# Patient Record
Sex: Male | Born: 1970 | Race: White | Hispanic: No | Marital: Single | Smoking: Never smoker
Health system: Southern US, Community
[De-identification: ages and names within clinical notes are randomized; demographics above are authoritative.]

## PROBLEM LIST (undated history)

## (undated) DIAGNOSIS — J45909 Unspecified asthma, uncomplicated: Secondary | ICD-10-CM

## (undated) DIAGNOSIS — F32A Depression, unspecified: Secondary | ICD-10-CM

## (undated) DIAGNOSIS — F329 Major depressive disorder, single episode, unspecified: Secondary | ICD-10-CM

## (undated) DIAGNOSIS — B019 Varicella without complication: Secondary | ICD-10-CM

## (undated) DIAGNOSIS — K219 Gastro-esophageal reflux disease without esophagitis: Secondary | ICD-10-CM

## (undated) HISTORY — DX: Varicella without complication: B01.9

## (undated) HISTORY — DX: Gastro-esophageal reflux disease without esophagitis: K21.9

## (undated) HISTORY — DX: Major depressive disorder, single episode, unspecified: F32.9

## (undated) HISTORY — PX: BACK SURGERY: SHX140

## (undated) HISTORY — DX: Depression, unspecified: F32.A

## (undated) HISTORY — PX: TONSILLECTOMY: SUR1361

---

## 2015-01-06 ENCOUNTER — Ambulatory Visit (HOSPITAL_COMMUNITY): Payer: Self-pay | Admitting: Certified Registered Nurse Anesthetist

## 2015-01-06 ENCOUNTER — Ambulatory Visit (HOSPITAL_COMMUNITY): Payer: Self-pay

## 2015-01-06 ENCOUNTER — Encounter (HOSPITAL_COMMUNITY)
Admission: RE | Disposition: A | Discharge status: Home / Self Care | Payer: Self-pay | Source: Ambulatory Visit | Attending: Neurosurgery

## 2015-01-06 ENCOUNTER — Encounter (HOSPITAL_COMMUNITY): Payer: Self-pay | Admitting: *Deleted

## 2015-01-06 ENCOUNTER — Other Ambulatory Visit (HOSPITAL_COMMUNITY): Payer: Self-pay | Admitting: Neurosurgery

## 2015-01-06 ENCOUNTER — Ambulatory Visit (HOSPITAL_COMMUNITY)
Admission: RE | Admit: 2015-01-06 | Discharge: 2015-01-07 | Disposition: A | Discharge status: Home / Self Care | Payer: Self-pay | Source: Ambulatory Visit | Attending: Neurosurgery | Admitting: Neurosurgery

## 2015-01-06 DIAGNOSIS — M5126 Other intervertebral disc displacement, lumbar region: Secondary | ICD-10-CM | POA: Diagnosis present

## 2015-01-06 DIAGNOSIS — Z419 Encounter for procedure for purposes other than remedying health state, unspecified: Secondary | ICD-10-CM

## 2015-01-06 DIAGNOSIS — J45909 Unspecified asthma, uncomplicated: Secondary | ICD-10-CM | POA: Insufficient documentation

## 2015-01-06 HISTORY — DX: Unspecified asthma, uncomplicated: J45.909

## 2015-01-06 HISTORY — PX: LUMBAR LAMINECTOMY/DECOMPRESSION MICRODISCECTOMY: SHX5026

## 2015-01-06 LAB — CBC
HEMATOCRIT: 48.2 % (ref 39.0–52.0)
Hemoglobin: 16.3 g/dL (ref 13.0–17.0)
MCH: 30.6 pg (ref 26.0–34.0)
MCHC: 33.8 g/dL (ref 30.0–36.0)
MCV: 90.4 fL (ref 78.0–100.0)
PLATELETS: 204 10*3/uL (ref 150–400)
RBC: 5.33 MIL/uL (ref 4.22–5.81)
RDW: 12.7 % (ref 11.5–15.5)
WBC: 9.9 10*3/uL (ref 4.0–10.5)

## 2015-01-06 SURGERY — LUMBAR LAMINECTOMY/DECOMPRESSION MICRODISCECTOMY 1 LEVEL
Anesthesia: General | Site: Back | Laterality: Left

## 2015-01-06 MED ORDER — FENTANYL CITRATE 0.05 MG/ML IJ SOLN
INTRAMUSCULAR | Status: AC
  Filled 2015-01-06: qty 5

## 2015-01-06 MED ORDER — SODIUM CHLORIDE 0.9 % IJ SOLN: 3.0000 mL | INTRAMUSCULAR | Status: DC | PRN

## 2015-01-06 MED ORDER — MENTHOL 3 MG MT LOZG: 1.0000 | LOZENGE | OROMUCOSAL | Status: DC | PRN

## 2015-01-06 MED ORDER — ARTIFICIAL TEARS OP OINT
TOPICAL_OINTMENT | OPHTHALMIC | Status: DC | PRN
  Administered 2015-01-06: 1 via OPHTHALMIC

## 2015-01-06 MED ORDER — PHENOL 1.4 % MT LIQD: 1.0000 | OROMUCOSAL | Status: DC | PRN

## 2015-01-06 MED ORDER — LIDOCAINE-EPINEPHRINE 0.5 %-1:200000 IJ SOLN
INTRAMUSCULAR | Status: DC | PRN
  Administered 2015-01-06: 20 mL via INTRADERMAL
  Administered 2015-01-06: 10 mL via INTRADERMAL

## 2015-01-06 MED ORDER — THROMBIN 5000 UNITS EX SOLR
CUTANEOUS | Status: DC | PRN
  Administered 2015-01-06 (×2): 5000 [IU] via TOPICAL

## 2015-01-06 MED ORDER — FAMOTIDINE 20 MG PO TABS
20.0000 mg | ORAL_TABLET | Freq: Every day | ORAL | Status: DC
  Administered 2015-01-06: 20 mg via ORAL
  Filled 2015-01-06 (×2): qty 1

## 2015-01-06 MED ORDER — ONDANSETRON HCL 4 MG/2ML IJ SOLN
INTRAMUSCULAR | Status: AC
  Filled 2015-01-06: qty 2

## 2015-01-06 MED ORDER — GLYCOPYRROLATE 0.2 MG/ML IJ SOLN
INTRAMUSCULAR | Status: DC | PRN
  Administered 2015-01-06: 0.4 mg via INTRAVENOUS

## 2015-01-06 MED ORDER — GLYCOPYRROLATE 0.2 MG/ML IJ SOLN
INTRAMUSCULAR | Status: AC
  Filled 2015-01-06: qty 2

## 2015-01-06 MED ORDER — ACETAMINOPHEN 650 MG RE SUPP
650.0000 mg | RECTAL | Status: DC | PRN
Start: 2015-01-06 — End: 2015-01-07

## 2015-01-06 MED ORDER — MIDAZOLAM HCL 2 MG/2ML IJ SOLN
INTRAMUSCULAR | Status: AC
  Filled 2015-01-06: qty 2

## 2015-01-06 MED ORDER — MORPHINE SULFATE 2 MG/ML IJ SOLN: 1.0000 mg | INTRAMUSCULAR | Status: DC | PRN

## 2015-01-06 MED ORDER — POTASSIUM CHLORIDE IN NACL 20-0.9 MEQ/L-% IV SOLN
INTRAVENOUS | Status: DC
  Filled 2015-01-06 (×3): qty 1000

## 2015-01-06 MED ORDER — ROCURONIUM BROMIDE 100 MG/10ML IV SOLN
INTRAVENOUS | Status: DC | PRN
  Administered 2015-01-06: 50 mg via INTRAVENOUS

## 2015-01-06 MED ORDER — NEOSTIGMINE METHYLSULFATE 10 MG/10ML IV SOLN
INTRAVENOUS | Status: DC | PRN
  Administered 2015-01-06: 3 mg via INTRAVENOUS

## 2015-01-06 MED ORDER — NEOSTIGMINE METHYLSULFATE 10 MG/10ML IV SOLN
INTRAVENOUS | Status: AC
  Filled 2015-01-06: qty 1

## 2015-01-06 MED ORDER — CYCLOBENZAPRINE HCL 10 MG PO TABS
ORAL_TABLET | ORAL | Status: AC
  Administered 2015-01-06: 10 mg via ORAL
  Filled 2015-01-06: qty 1

## 2015-01-06 MED ORDER — LACTATED RINGERS IV SOLN
INTRAVENOUS | Status: DC | PRN
  Administered 2015-01-06 (×2): via INTRAVENOUS

## 2015-01-06 MED ORDER — CEFAZOLIN SODIUM-DEXTROSE 2-3 GM-% IV SOLR
INTRAVENOUS | Status: AC
  Filled 2015-01-06: qty 50

## 2015-01-06 MED ORDER — KETOROLAC TROMETHAMINE 30 MG/ML IJ SOLN
INTRAMUSCULAR | Status: AC
  Filled 2015-01-06: qty 1

## 2015-01-06 MED ORDER — PNEUMOCOCCAL VAC POLYVALENT 25 MCG/0.5ML IJ INJ
0.5000 mL | INJECTION | INTRAMUSCULAR | Status: DC
  Filled 2015-01-06: qty 0.5

## 2015-01-06 MED ORDER — EPHEDRINE SULFATE 50 MG/ML IJ SOLN
INTRAMUSCULAR | Status: DC | PRN
  Administered 2015-01-06: 5 mg via INTRAVENOUS

## 2015-01-06 MED ORDER — ONDANSETRON HCL 4 MG/2ML IJ SOLN: 4.0000 mg | INTRAMUSCULAR | Status: DC | PRN

## 2015-01-06 MED ORDER — ALBUTEROL (5 MG/ML) CONTINUOUS INHALATION SOLN
3.0000 mL | INHALATION_SOLUTION | Freq: Four times a day (QID) | RESPIRATORY_TRACT | Status: DC | PRN
  Filled 2015-01-06: qty 20

## 2015-01-06 MED ORDER — FENTANYL CITRATE 0.05 MG/ML IJ SOLN
INTRAMUSCULAR | Status: DC | PRN
  Administered 2015-01-06: 100 ug via INTRAVENOUS
  Administered 2015-01-06: 150 ug via INTRAVENOUS
  Administered 2015-01-06: 50 ug via INTRAVENOUS

## 2015-01-06 MED ORDER — OXYCODONE-ACETAMINOPHEN 5-325 MG PO TABS: 1.0000 | ORAL_TABLET | ORAL | Status: DC | PRN

## 2015-01-06 MED ORDER — SODIUM CHLORIDE 0.9 % IJ SOLN
3.0000 mL | Freq: Two times a day (BID) | INTRAMUSCULAR | Status: DC
  Administered 2015-01-06: 3 mL via INTRAVENOUS

## 2015-01-06 MED ORDER — LIDOCAINE HCL (CARDIAC) 20 MG/ML IV SOLN
INTRAVENOUS | Status: DC | PRN
  Administered 2015-01-06: 80 mg via INTRAVENOUS

## 2015-01-06 MED ORDER — LACTATED RINGERS IV SOLN
INTRAVENOUS | Status: DC
  Administered 2015-01-06: 15:00:00 via INTRAVENOUS

## 2015-01-06 MED ORDER — 0.9 % SODIUM CHLORIDE (POUR BTL) OPTIME
TOPICAL | Status: DC | PRN
  Administered 2015-01-06: 1000 mL

## 2015-01-06 MED ORDER — SODIUM CHLORIDE 0.9 % IV SOLN: 250.0000 mL | INTRAVENOUS | Status: DC

## 2015-01-06 MED ORDER — ACETAMINOPHEN 325 MG PO TABS: 650.0000 mg | ORAL_TABLET | ORAL | Status: DC | PRN

## 2015-01-06 MED ORDER — ALBUTEROL SULFATE HFA 108 (90 BASE) MCG/ACT IN AERS
INHALATION_SPRAY | RESPIRATORY_TRACT | Status: DC | PRN
  Administered 2015-01-06 (×2): 4 via RESPIRATORY_TRACT

## 2015-01-06 MED ORDER — KETOROLAC TROMETHAMINE 30 MG/ML IJ SOLN
30.0000 mg | Freq: Four times a day (QID) | INTRAMUSCULAR | Status: DC
  Administered 2015-01-06 – 2015-01-07 (×3): 30 mg via INTRAVENOUS
  Filled 2015-01-06 (×6): qty 1

## 2015-01-06 MED ORDER — SENNA 8.6 MG PO TABS
1.0000 | ORAL_TABLET | Freq: Two times a day (BID) | ORAL | Status: DC
  Administered 2015-01-06 – 2015-01-07 (×2): 8.6 mg via ORAL
  Filled 2015-01-06 (×3): qty 1

## 2015-01-06 MED ORDER — PROPOFOL 10 MG/ML IV BOLUS
INTRAVENOUS | Status: DC | PRN
  Administered 2015-01-06: 200 mg via INTRAVENOUS

## 2015-01-06 MED ORDER — CYCLOBENZAPRINE HCL 10 MG PO TABS
10.0000 mg | ORAL_TABLET | Freq: Three times a day (TID) | ORAL | Status: DC | PRN
  Administered 2015-01-06 – 2015-01-07 (×2): 10 mg via ORAL
  Filled 2015-01-06 (×3): qty 1

## 2015-01-06 MED ORDER — INFLUENZA VAC SPLIT QUAD 0.5 ML IM SUSY
0.5000 mL | PREFILLED_SYRINGE | INTRAMUSCULAR | Status: AC
  Administered 2015-01-07: 0.5 mL via INTRAMUSCULAR
  Filled 2015-01-06: qty 0.5

## 2015-01-06 MED ORDER — ONDANSETRON HCL 4 MG/2ML IJ SOLN
INTRAMUSCULAR | Status: DC | PRN
  Administered 2015-01-06: 4 mg via INTRAVENOUS

## 2015-01-06 MED ORDER — HEMOSTATIC AGENTS (NO CHARGE) OPTIME
TOPICAL | Status: DC | PRN
  Administered 2015-01-06: 1 via TOPICAL

## 2015-01-06 MED ORDER — HYDROCODONE-ACETAMINOPHEN 5-325 MG PO TABS
1.0000 | ORAL_TABLET | ORAL | Status: DC | PRN
  Administered 2015-01-06: 2 via ORAL
  Filled 2015-01-06: qty 2

## 2015-01-06 MED ORDER — MAGNESIUM CITRATE PO SOLN
1.0000 | Freq: Once | ORAL | Status: AC | PRN
  Filled 2015-01-06: qty 296

## 2015-01-06 SURGICAL SUPPLY — 52 items
BAG DECANTER FOR FLEXI CONT (MISCELLANEOUS) ×3 IMPLANT
BENZOIN TINCTURE PRP APPL 2/3 (GAUZE/BANDAGES/DRESSINGS) IMPLANT
BLADE CLIPPER SURG (BLADE) IMPLANT
BUR MATCHSTICK NEURO 3.0 LAGG (BURR) ×3 IMPLANT
CANISTER SUCT 3000ML PPV (MISCELLANEOUS) ×3 IMPLANT
CLOSURE WOUND 1/2 X4 (GAUZE/BANDAGES/DRESSINGS)
CONT SPEC 4OZ CLIKSEAL STRL BL (MISCELLANEOUS) ×3 IMPLANT
DECANTER SPIKE VIAL GLASS SM (MISCELLANEOUS) ×3 IMPLANT
DRAPE LAPAROTOMY 100X72X124 (DRAPES) ×3 IMPLANT
DRAPE MICROSCOPE LEICA (MISCELLANEOUS) ×3 IMPLANT
DRAPE POUCH INSTRU U-SHP 10X18 (DRAPES) ×3 IMPLANT
DRAPE SURG 17X23 STRL (DRAPES) ×3 IMPLANT
DURAPREP 26ML APPLICATOR (WOUND CARE) ×3 IMPLANT
ELECT REM PT RETURN 9FT ADLT (ELECTROSURGICAL) ×3
ELECTRODE REM PT RTRN 9FT ADLT (ELECTROSURGICAL) ×1 IMPLANT
GAUZE SPONGE 4X4 12PLY STRL (GAUZE/BANDAGES/DRESSINGS) IMPLANT
GAUZE SPONGE 4X4 16PLY XRAY LF (GAUZE/BANDAGES/DRESSINGS) IMPLANT
GLOVE BIO SURGEON STRL SZ 6.5 (GLOVE) ×6 IMPLANT
GLOVE BIO SURGEON STRL SZ8 (GLOVE) ×3 IMPLANT
GLOVE BIO SURGEONS STRL SZ 6.5 (GLOVE) ×3
GLOVE ECLIPSE 6.5 STRL STRAW (GLOVE) ×3 IMPLANT
GLOVE EXAM NITRILE LRG STRL (GLOVE) IMPLANT
GLOVE EXAM NITRILE MD LF STRL (GLOVE) IMPLANT
GLOVE EXAM NITRILE XL STR (GLOVE) IMPLANT
GLOVE EXAM NITRILE XS STR PU (GLOVE) IMPLANT
GLOVE INDICATOR 6.5 STRL GRN (GLOVE) ×3 IMPLANT
GLOVE INDICATOR 8.5 STRL (GLOVE) ×3 IMPLANT
GOWN STRL REUS W/ TWL LRG LVL3 (GOWN DISPOSABLE) ×3 IMPLANT
GOWN STRL REUS W/ TWL XL LVL3 (GOWN DISPOSABLE) IMPLANT
GOWN STRL REUS W/TWL 2XL LVL3 (GOWN DISPOSABLE) IMPLANT
GOWN STRL REUS W/TWL LRG LVL3 (GOWN DISPOSABLE) ×6
GOWN STRL REUS W/TWL XL LVL3 (GOWN DISPOSABLE)
KIT BASIN OR (CUSTOM PROCEDURE TRAY) ×3 IMPLANT
KIT ROOM TURNOVER OR (KITS) ×3 IMPLANT
LIQUID BAND (GAUZE/BANDAGES/DRESSINGS) ×3 IMPLANT
NEEDLE HYPO 25X1 1.5 SAFETY (NEEDLE) ×3 IMPLANT
NEEDLE SPNL 18GX3.5 QUINCKE PK (NEEDLE) IMPLANT
NS IRRIG 1000ML POUR BTL (IV SOLUTION) ×3 IMPLANT
PACK LAMINECTOMY NEURO (CUSTOM PROCEDURE TRAY) ×3 IMPLANT
PAD ARMBOARD 7.5X6 YLW CONV (MISCELLANEOUS) ×9 IMPLANT
RUBBERBAND STERILE (MISCELLANEOUS) ×6 IMPLANT
SPONGE LAP 4X18 X RAY DECT (DISPOSABLE) IMPLANT
SPONGE SURGIFOAM ABS GEL SZ50 (HEMOSTASIS) ×3 IMPLANT
STRIP CLOSURE SKIN 1/2X4 (GAUZE/BANDAGES/DRESSINGS) IMPLANT
SUT VIC AB 0 CT1 18XCR BRD8 (SUTURE) ×1 IMPLANT
SUT VIC AB 0 CT1 8-18 (SUTURE) ×2
SUT VIC AB 2-0 CT1 18 (SUTURE) ×3 IMPLANT
SUT VIC AB 3-0 SH 8-18 (SUTURE) ×6 IMPLANT
SYR 20ML ECCENTRIC (SYRINGE) ×3 IMPLANT
TOWEL OR 17X24 6PK STRL BLUE (TOWEL DISPOSABLE) ×3 IMPLANT
TOWEL OR 17X26 10 PK STRL BLUE (TOWEL DISPOSABLE) ×3 IMPLANT
WATER STERILE IRR 1000ML POUR (IV SOLUTION) ×3 IMPLANT

## 2015-01-06 NOTE — Op Note (Signed)
01/06/2015  8:02 PM  PATIENT:  Joel Cline  44 y.o. male presents with severe left lower extremity pain, and a large free fragment on the left at L4/5. He has agreed to operative decompression.   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 [name] was taken to the operating room, intubated and placed under a general anesthetic without difficulty. He was positioned prone on a Wilson frame with all pressure points padded. His back was prepped and draped in a sterile manner. I opened the skin with a 10 blade and carried the dissection down to the thoracolumbar fascia. I used both sharp dissection and the monopolar cautery to expose the lamina of 3,4, and 5. I confirmed my location with an intraoperative xray.  I used the drill, Kerrison punches, and curettes to perform a semihemilaminectomy of L4. I used the punches to remove the ligamentum flavum to expose the thecal sac. I brought the microscope into the operative field and with Dr.Stern's assistance we started our decompression of the spinal canal, thecal sac and L5 root(s). I cauterized epidural veins overlying the disc space then divided them sharply. I opened the disc space with a 15 blade and proceeded with the discectomy. I used pituitary rongeurs, curettes, and other instruments to remove disc material. After the discectomy was completed we inspected the L5 nerve root and felt it was well decompressed. I explored rostrally, laterally, medially, and caudally and was satisfied with the decompression. I irrigated the  wound, then closed in layers. I approximated the thoracolumbar fascia, subcutaneous, and subcuticular planes with vicryl sutures. I used dermabond for a sterile dressing.   PLAN OF CARE: Admit for overnight observation  PATIENT DISPOSITION:  PACU - hemodynamically stable.   Delay start of Pharmacological VTE agent (>24hrs) due to surgical blood loss or risk of bleeding:  yes

## 2015-01-06 NOTE — Anesthesia Postprocedure Evaluation (Signed)
  Anesthesia Post-op Note  Patient: Joel Cline  Procedure(s) Performed: Procedure(s) with comments: Left Lumbar 4-5 diskectomy (Left) - Left L4-5 diskectomy  Patient Location: PACU  Anesthesia Type:General  Level of Consciousness: awake, alert , oriented and patient cooperative  Airway and Oxygen Therapy: Patient Spontanous Breathing  Post-op Pain: mild  Post-op Assessment: Post-op Vital signs reviewed, Patient's Cardiovascular Status Stable, Respiratory Function Stable, Patent Airway, No signs of Nausea or vomiting and Pain level controlled  Post-op Vital Signs: stable  Last Vitals:  Filed Vitals:   01/06/15 2055  BP: 128/81  Pulse: 73  Temp: 36.7 C  Resp: 12    Complications: No apparent anesthesia complications

## 2015-01-06 NOTE — Transfer of Care (Signed)
Immediate Anesthesia Transfer of Care Note  Patient: Joel Cline  Procedure(s) Performed: Procedure(s) with comments: Left Lumbar 4-5 diskectomy (Left) - Left L4-5 diskectomy  Patient Location: PACU  Anesthesia Type:General  Level of Consciousness: awake, alert  and oriented  Airway & Oxygen Therapy: Patient Spontanous Breathing and Patient connected to nasal cannula oxygen  Post-op Assessment: Report given to RN, Post -op Vital signs reviewed and stable and Patient moving all extremities  Post vital signs: Reviewed and stable  Last Vitals:  Filed Vitals:   01/06/15 1442  BP: 149/85  Pulse: 74  Temp: 36.9 C  Resp: 18    Complications: No apparent anesthesia complications

## 2015-01-06 NOTE — Anesthesia Preprocedure Evaluation (Signed)
Anesthesia Evaluation  Patient identified by MRN, date of birth, ID band Patient awake    Reviewed: Allergy & Precautions, NPO status , Patient's Chart, lab work & pertinent test results  Airway        Dental   Pulmonary asthma ,          Cardiovascular negative cardio ROS      Neuro/Psych negative neurological ROS     GI/Hepatic Neg liver ROS, GERD-  ,  Endo/Other  Morbid obesity  Renal/GU negative Renal ROS     Musculoskeletal   Abdominal   Peds  Hematology negative hematology ROS (+)   Anesthesia Other Findings   Reproductive/Obstetrics                             Anesthesia Physical Anesthesia Plan  ASA: II  Anesthesia Plan: General   Post-op Pain Management:    Induction: Intravenous  Airway Management Planned: Oral ETT  Additional Equipment:   Intra-op Plan:   Post-operative Plan: Extubation in OR  Informed Consent: I have reviewed the patients History and Physical, chart, labs and discussed the procedure including the risks, benefits and alternatives for the proposed anesthesia with the patient or authorized representative who has indicated his/her understanding and acceptance.   Dental advisory given  Plan Discussed with: CRNA  Anesthesia Plan Comments:         Anesthesia Quick Evaluation

## 2015-01-06 NOTE — Anesthesia Procedure Notes (Signed)
Procedure Name: Intubation Date/Time: 01/06/2015 6:43 PM Performed by: Trixie Deis A Pre-anesthesia Checklist: Patient identified, Timeout performed, Emergency Drugs available, Suction available and Patient being monitored Patient Re-evaluated:Patient Re-evaluated prior to inductionOxygen Delivery Method: Circle system utilized Preoxygenation: Pre-oxygenation with 100% oxygen Intubation Type: IV induction Ventilation: Mask ventilation without difficulty and Oral airway inserted - appropriate to patient size Laryngoscope Size: Mac and 4 Grade View: Grade I Tube type: Oral Tube size: 7.5 mm Number of attempts: 1 Airway Equipment and Method: Stylet and LTA kit utilized Placement Confirmation: ETT inserted through vocal cords under direct vision,  breath sounds checked- equal and bilateral and positive ETCO2 Secured at: 23 cm Tube secured with: Tape Dental Injury: Teeth and Oropharynx as per pre-operative assessment

## 2015-01-06 NOTE — H&P (Signed)
Joel Cline is an 44 y.o. male.   Chief Complaint: left lower extremity pain HPI: Joel Cline is a 44 y.o. male Whom developed severe pain in the left lower extremity after working on 3/18. He came to the ED at [facility] and underwent an MRI of the lumbar spine which revealed a large disc herniation at L4/5 eccentric to the left. I spoke with him while in the ED and offered admission and operative decompression on the 19th. He wanted to go home and see me in the office. He came in today in severe pain. No history of pain like this in the past. No discomfort on the right side. He has been taking steroids, and muscle relaxants without relief.   Past Medical History  Diagnosis Date  . Asthma     last attack >3 yrs    Past Surgical History  Procedure Laterality Date  . Tonsillectomy      History reviewed. No pertinent family history. Social History:  reports that she has never smoked. She does not have any smokeless tobacco history on file. She reports that she drinks alcohol. She reports that she does not use illicit drugs.  Allergies: No Known Allergies  Medications Prior to Admission  Medication Sig Dispense Refill  . albuterol (PROVENTIL HFA;VENTOLIN HFA) 108 (90 BASE) MCG/ACT inhaler Inhale 1 puff into the lungs every 6 (six) hours as needed for wheezing or shortness of breath.    Marland Kitchen ibuprofen (ADVIL,MOTRIN) 200 MG tablet Take 200 mg by mouth every 6 (six) hours as needed for moderate pain.    . ranitidine (ZANTAC) 150 MG tablet Take 150 mg by mouth daily as needed for heartburn.      Results for orders placed or performed during the hospital encounter of 01/06/15 (from the past 48 hour(s))  CBC     Status: Abnormal   Collection Time: 01/06/15  3:07 PM  Result Value Ref Range   WBC 9.9 4.0 - 10.5 K/uL   RBC 5.33 (H) 3.87 - 5.11 MIL/uL   Hemoglobin 16.3 (H) 12.0 - 15.0 g/dL   HCT 16.1 (H) 09.6 - 04.5 %   MCV 90.4 78.0 - 100.0 fL   MCH 30.6 26.0 - 34.0 pg   MCHC 33.8  30.0 - 36.0 g/dL   RDW 40.9 81.1 - 91.4 %   Platelets 204 150 - 400 K/uL   No results found.  Review of Systems  Constitutional: Negative.   HENT: Negative.   Eyes: Negative.   Respiratory: Negative.   Cardiovascular: Negative.   Gastrointestinal: Negative.   Genitourinary: Negative.   Musculoskeletal: Positive for back pain.  Skin: Negative.   Neurological: Positive for focal weakness.  Endo/Heme/Allergies: Negative.   Psychiatric/Behavioral: Negative.     Blood pressure 149/85, pulse 74, temperature 98.5 F (36.9 C), temperature source Oral, resp. rate 18, height  (1.803 m), weight 104.327 kg (230 lb), SpO2 97 %. Physical Exam  Constitutional: She is oriented to person, place, and time. She appears well-developed and well-nourished.  HENT:  Head: Normocephalic and atraumatic.  Eyes: Conjunctivae and EOM are normal. Pupils are equal, round, and reactive to light.  Neck: Normal range of motion. Neck supple.  Cardiovascular: Normal rate, regular rhythm and normal heart sounds.   GI: Soft. Bowel sounds are normal.  Musculoskeletal: Normal range of motion.  Neurological: She is alert and oriented to person, place, and time. She displays abnormal reflex. No cranial nerve deficit. She exhibits normal muscle tone. She displays a negative Romberg  sign. Coordination normal.  Weakness in left dorsiflexors, plantarflexors, quads Intact proprioception + straight leg raising Gait is antalgic  Skin: Skin is warm and dry.  Psychiatric: She has a normal mood and affect. Her behavior is normal. Judgment and thought content normal.     Assessment/Plan BP 149/85 mmHg  Pulse 74  Temp(Src) 98.5 F (36.9 C) (Oral)  Resp 18  Ht 5\' 11"  (1.803 m)  Wt 104.327 kg (230 lb)  BMI 32.09 kg/m2  SpO2 97% Joel Cline Bellucci has decided to undergo a lumbar discetomy/decompression for an HNP at levels L4/5. Risks and benefits including but not limited to bleeding, infection, paralysis, weakness in  one or both extremities, bowel and/or bladder dysfunction, need for further surgery, no relief of pain. Joel Cline Staffieri understands and wishes to proceed.  Libbie Bartley L 01/06/2015, 6:07 PM

## 2015-01-07 ENCOUNTER — Encounter (HOSPITAL_COMMUNITY): Payer: Self-pay | Admitting: Neurosurgery

## 2015-01-07 MED ORDER — HYDROCODONE-ACETAMINOPHEN 5-325 MG PO TABS: 1.0000 | ORAL_TABLET | Freq: Four times a day (QID) | ORAL | Status: DC | PRN

## 2015-01-07 MED ORDER — HYDROCODONE-ACETAMINOPHEN 5-325 MG PO TABS: 1.0000 | ORAL_TABLET | ORAL | Status: DC | PRN

## 2015-01-07 MED ORDER — CYCLOBENZAPRINE HCL 10 MG PO TABS: 10.0000 mg | ORAL_TABLET | Freq: Three times a day (TID) | ORAL | Status: DC | PRN

## 2015-01-07 NOTE — Discharge Instructions (Signed)
Lumbar Discectomy °Care After °A discectomy involves removal of discmaterial (the cartilage-like structures located between the bones of the back). It is done to relieve pressure on nerve roots. It can be used as a treatment for a back problem. The time in surgery depends on the findings in surgery and what is necessary to correct the problems. °HOME CARE INSTRUCTIONS  °· Check the cut (incision) made by the surgeon twice a day for signs of infection. Some signs of infection may include:  °· A foul smelling, greenish or yellowish discharge from the wound.  °· Increased pain.  °· Increased redness over the incision (operative) site.  °· The skin edges may separate.  °· Flu-like symptoms (problems).  °· A temperature above 101.5° F (38.6° C).  °· Change your bandages in about 24 to 36 hours following surgery or as directed.  °· You may shower tomrrow.  Avoid bathtubs, swimming pools and hot tubs for three weeks or until your incision has healed completely. °· Follow your doctor's instructions as to safe activities, exercises, and physical therapy.  °· Weight reduction may be beneficial if you are overweight.  °· Daily exercise is helpful to prevent the return of problems. Walking is permitted. You may use a treadmill without an incline. Cut down on activities and exercise if you have discomfort. You may also go up and down stairs as much as you can tolerate.  °· DO NOT lift anything heavier than 10 to 15 lbs. Avoid bending or twisting at the waist. Always bend your knees when lifting.  °· Maintain strength and range of motion as instructed.  °· Do not drive for 10 days, or as directed by your doctors. You may be a passenger . Lying back in the passenger seat may be more comfortable for you. Always wear a seatbelt.  °· Limit your sitting in a regular chair to 20 to 30 minutes at a time. There are no limitations for sitting in a recliner. You should lie down or walk in between sitting periods.  °· Only take  over-the-counter or prescription medicines for pain, discomfort, or fever as directed by your caregiver.  °SEEK MEDICAL CARE IF:  °· There is increased bleeding (more than a small spot) from the wound.  °· You notice redness, swelling, or increasing pain in the wound.  °· Pus is coming from wound.  °· You develop an unexplained oral temperature above 102° F (38.9° C) develops.  °· You notice a foul smell coming from the wound or dressing.  °· You have increasing pain in your wound.  °SEEK IMMEDIATE MEDICAL CARE IF:  °· You develop a rash.  °· You have difficulty breathing.  °· You develop any allergic problems to medicines given.  °Document Released: 09/07/2004 Document Revised: 09/22/2011 Document Reviewed: 12/27/2007 °ExitCare® Patient Information ° ° ° °Wound Care °Leave incision open to air. °You may shower. °Do not scrub directly on incision.  °Do not put any creams, lotions, or ointments on incision. °Activity °Walk each and every day, increasing distance each day. °No lifting greater than 5 lbs.  Avoid bending, arching, and twisting. °No driving for 2 weeks; may ride as a passenger locally. °If provided with back brace, wear when out of bed.  It is not necessary to wear in bed. °Diet °Resume your normal diet.  °Return to Work °Will be discussed at you follow up appointment. °Call Your Doctor If Any of These Occur °Redness, drainage, or swelling at the wound.  °Temperature greater than   101 degrees. °Severe pain not relieved by pain medication. °Incision starts to come apart. °Follow Up Appt °Call today for appointment in 4 weeks ([phone]) or for problems.  If you have any hardware placed in your spine, you will need an x-ray before your appointment. °

## 2015-01-07 NOTE — Discharge Summary (Signed)
  Physician Discharge Summary  Patient ID: Joel Cline MRN: [id number] DOB/AGE: [birth year] 46 y.o.  Admit date: 01/06/2015 Discharge date: 01/07/2015  Admission Diagnoses:HNP L4/5, left  Discharge Diagnoses:  Active Problems:   HNP (herniated nucleus pulposus), lumbar   Discharged Condition: good  Hospital Course: Joel Cline was admitted and taken to the operating room for an uncomplicated lumbar laminectomy and discetomy at L4/5. Post op he is ambulating, voiding, and tolerating a regular diet. His wound is clean, dry, and without signs of infection. He is being dishcarged this am.   Treatments: surgery: as above  Discharge Exam: Blood pressure 130/71, pulse 69, temperature 98.3 F (36.8 C), temperature source Oral, resp. rate 18, height 5\' 11"  (1.803 m), weight 104.327 kg (230 lb), SpO2 99 %. General appearance: alert, cooperative, appears stated age and no distress Neurologic: Alert and oriented X 3, normal strength and tone. Normal symmetric reflexes. Normal coordination and gait  Disposition: Final discharge disposition not confirmed lumbar herniated disc    Medication List    TAKE these medications        albuterol 108 (90 BASE) MCG/ACT inhaler  Commonly known as:  PROVENTIL HFA;VENTOLIN HFA  Inhale 1 puff into the lungs every 6 (six) hours as needed for wheezing or shortness of breath.     cyclobenzaprine 10 MG tablet  Commonly known as:  FLEXERIL  Take 1 tablet (10 mg total) by mouth 3 (three) times daily as needed for muscle spasms.     HYDROcodone-acetaminophen 5-325 MG per tablet  Commonly known as:  NORCO/VICODIN  Take 1-2 tablets by mouth every 4 (four) hours as needed (mild pain).     HYDROcodone-acetaminophen 5-325 MG per tablet  Commonly known as:  NORCO/VICODIN  Take 1 tablet by mouth every 6 (six) hours as needed for moderate pain.     ibuprofen 200 MG tablet  Commonly known as:  ADVIL,MOTRIN  Take 200 mg by mouth every 6 (six) hours as needed  for moderate pain.     ranitidine 150 MG tablet  Commonly known as:  ZANTAC  Take 150 mg by mouth daily as needed for heartburn.           Follow-up Information    Follow up with Collan Schoenfeld L, MD In 3 weeks.   Specialty:  Neurosurgery   Why:  call office to make an appointment   Contact information:   [address] [place] [address] [phone]       Signed: [name],Joel Cline 01/07/2015, 9:50 AM

## 2015-01-07 NOTE — Progress Notes (Signed)
Patient alert and oriented, mae's well, voiding adequate amount of urine, swallowing without difficulty, no c/o pain. Patient discharged home with family. Script and discharged instructions given to patient. Patient and family stated understanding of d/c instructions given and has an appointment with MD. Aisha Courtenay Creger RN 

## 2016-02-01 ENCOUNTER — Encounter: Payer: Self-pay | Admitting: Family Medicine

## 2016-02-01 ENCOUNTER — Ambulatory Visit: Payer: Self-pay | Admitting: Family Medicine

## 2016-02-01 ENCOUNTER — Ambulatory Visit (INDEPENDENT_AMBULATORY_CARE_PROVIDER_SITE_OTHER): Payer: BLUE CROSS/BLUE SHIELD | Admitting: Family Medicine

## 2016-02-01 VITALS — BP 126/86 | HR 87 | Temp 98.0°F | Ht 72.0 in | Wt 234.2 lb

## 2016-02-01 DIAGNOSIS — Z0000 Encounter for general adult medical examination without abnormal findings: Secondary | ICD-10-CM | POA: Diagnosis not present

## 2016-02-01 DIAGNOSIS — Z130 Encounter for screening for diseases of the blood and blood-forming organs and certain disorders involving the immune mechanism: Secondary | ICD-10-CM | POA: Diagnosis not present

## 2016-02-01 DIAGNOSIS — L918 Other hypertrophic disorders of the skin: Secondary | ICD-10-CM

## 2016-02-01 DIAGNOSIS — J4540 Moderate persistent asthma, uncomplicated: Secondary | ICD-10-CM

## 2016-02-01 DIAGNOSIS — M7710 Lateral epicondylitis, unspecified elbow: Secondary | ICD-10-CM | POA: Insufficient documentation

## 2016-02-01 DIAGNOSIS — M7712 Lateral epicondylitis, left elbow: Secondary | ICD-10-CM | POA: Diagnosis not present

## 2016-02-01 DIAGNOSIS — E669 Obesity, unspecified: Secondary | ICD-10-CM | POA: Diagnosis not present

## 2016-02-01 MED ORDER — ALBUTEROL SULFATE HFA 108 (90 BASE) MCG/ACT IN AERS: 2.0000 | INHALATION_SPRAY | Freq: Four times a day (QID) | RESPIRATORY_TRACT | Status: DC | PRN

## 2016-02-01 MED ORDER — BUDESONIDE-FORMOTEROL FUMARATE 160-4.5 MCG/ACT IN AERO: 2.0000 | INHALATION_SPRAY | Freq: Two times a day (BID) | RESPIRATORY_TRACT | Status: DC

## 2016-02-01 NOTE — Progress Notes (Signed)
Subjective:  Patient ID: Joel Cline, male    DOB: [birth year]  Age: 45 y.o. MRN: [id number]  CC: Establish care; Asthma; Skin tags; Left elbow pain  HPI Joel Cline is a 45 y.o. male presents to the clinic today to establish care. Additional concerns outlined below.  Preventative Healthcare  Colonoscopy: N/A   Immunizations  Tetanus - Up to date.  Pneumococcal - in need of given asthma.  Flu - Not indicated currently (given it is April).  Zoster - N/A.  Hepatitis C screening - N/A.  Labs: Screening labs today.   Exercise: No regular exercise.   Alcohol use: yes. See below. Alcohol abuse.  Smoking/tobacco use: No.  Wears seat belt: Yes.   Asthma  Long standing history of asthma.  Has been on controller meds in the past but is not currently.  He is out of albuterol and needs refill.  Prior to being out of albuterol, patient was using 1-2 times daily.  Skin Tags   Long standing history of skin tags.  Located in the axilla (bilaterally).  Patient states they are bothersome, particularly cosmetically.  Would like to discuss treatment options today.  Left elbow pain  Intermittent.  Worse as of late.  Location - lateral epicondyle.   Exacerbated by work/certain activities.  He has has some relief with bracing, anti-inflammatories.  PMH, Surgical Hx, Family Hx, Social History reviewed and updated as below.  Past Medical History  Diagnosis Date  . Asthma   . Chicken pox   . GERD (gastroesophageal reflux disease)   . Depression    Past Surgical History  Procedure Laterality Date  . Tonsillectomy    . Lumbar laminectomy/decompression microdiscectomy Left 01/06/2015    Procedure: Left Lumbar 4-5 diskectomy;  Surgeon: Ashok Pall, MD;  Location: [facility];  Service: Neurosurgery;  Laterality: Left;  Left L4-5 diskectomy   Family History  Problem Relation Age of Onset  . Diabetes Father   . Arthritis Sister    Social History    Substance Use Topics  . Smoking status: Never Smoker   . Smokeless tobacco: Never Used  . Alcohol Use: 25.2 oz/week    0 Standard drinks or equivalent, 42 Cans of beer per week     Comment: daily   Review of Systems  Respiratory: Positive for shortness of breath.   Musculoskeletal:       Elbow pain.  Skin:       Skin tags.  Psychiatric/Behavioral:       Anxiety, stress.  All other systems reviewed and are negative.  Objective:   Today's Vitals: BP 126/86 mmHg  Pulse 87  Temp(Src) 98 F (36.7 C) (Oral)  Ht 6' (1.829 m)  Wt 234 lb 4 oz (106.255 kg)  BMI 31.76 kg/m2  SpO2 96%  Physical Exam  Constitutional: He is oriented to person, place, and time. He appears well-developed and well-nourished. No distress.  HENT:  Head: Normocephalic and atraumatic.  Mouth/Throat: Oropharynx is clear and moist. No oropharyngeal exudate.  Normal TM's bilaterally.   Eyes: Conjunctivae are normal. No scleral icterus.  Neck: Neck supple.  Cardiovascular: Normal rate and regular rhythm.   No murmur heard. Pulmonary/Chest: Effort normal and breath sounds normal. He has no wheezes. He has no rales.  Abdominal: Soft. He exhibits no distension. There is no tenderness. There is no rebound and no guarding.  Musculoskeletal:  Elbow: Unremarkable to inspection. Full ROM. Tenderness over the lateral epicondyle.   Lymphadenopathy:    He has no  cervical adenopathy.  Neurological: He is alert and oriented to person, place, and time.  Skin:  Numerous skin tags noted in the axilla, bilaterally. Vary in size.  Psychiatric: He has a normal mood and affect.  Vitals reviewed.  Assessment & Plan:   Problem List Items Addressed This Visit    Preventative health care - Primary    Tdap up to date. In need of pneumococcal vaccine.  Screening labs today.        Asthma, moderate persistent    Uncontrolled. Albuterol refilled. Starting patient on symbicort.      Relevant Medications    budesonide-formoterol (SYMBICORT) 160-4.5 MCG/ACT inhaler   albuterol (PROVENTIL HFA;VENTOLIN HFA) 108 (90 Base) MCG/ACT inhaler   Cutaneous skin tags    I still do not have the necessary equipment/procedure items to do this. Referring to Derm.      Lateral epicondylitis    Discussed treatment options today - NSAID's, compression, Injection, Oral steroids. Patient will continue NSAID's and compression.       Other Visit Diagnoses    Screening for deficiency anemia        Relevant Orders    CBC    Obesity (BMI 30.0-34.9)        Relevant Orders    Comp Met (CMET)    Lipid Profile    HgB A1c       Outpatient Encounter Prescriptions as of 02/01/2016  Medication Sig  . albuterol (PROVENTIL HFA;VENTOLIN HFA) 108 (90 BASE) MCG/ACT inhaler Inhale 1 puff into the lungs every 6 (six) hours as needed for wheezing or shortness of breath.  Marland Kitchen ibuprofen (ADVIL,MOTRIN) 200 MG tablet Take 200 mg by mouth every 6 (six) hours as needed for moderate pain.  Marland Kitchen albuterol (PROVENTIL HFA;VENTOLIN HFA) 108 (90 Base) MCG/ACT inhaler Inhale 2 puffs into the lungs every 6 (six) hours as needed for wheezing or shortness of breath.  . budesonide-formoterol (SYMBICORT) 160-4.5 MCG/ACT inhaler Inhale 2 puffs into the lungs 2 (two) times daily.  . [DISCONTINUED] cyclobenzaprine (FLEXERIL) 10 MG tablet Take 1 tablet (10 mg total) by mouth 3 (three) times daily as needed for muscle spasms.  . [DISCONTINUED] HYDROcodone-acetaminophen (NORCO/VICODIN) 5-325 MG per tablet Take 1-2 tablets by mouth every 4 (four) hours as needed (mild pain).  . [DISCONTINUED] HYDROcodone-acetaminophen (NORCO/VICODIN) 5-325 MG per tablet Take 1 tablet by mouth every 6 (six) hours as needed for moderate pain.  . [DISCONTINUED] ranitidine (ZANTAC) 150 MG tablet Take 150 mg by mouth daily as needed for heartburn.   No facility-administered encounter medications on file as of 02/01/2016.    Follow-up: Annually or sooner if needed.  [place]

## 2016-02-01 NOTE — Assessment & Plan Note (Signed)
Tdap up to date. In need of pneumococcal vaccine.  Screening labs today.

## 2016-02-01 NOTE — Assessment & Plan Note (Signed)
I still do not have the necessary equipment/procedure items to do this. Referring to Derm.

## 2016-02-01 NOTE — Assessment & Plan Note (Signed)
Uncontrolled. Albuterol refilled. Starting patient on symbicort.

## 2016-02-01 NOTE — Patient Instructions (Signed)
We will call with your lab results.  We will call with your referral to derm.  Follow up annually or sooner if needed.  Take care  Dr. Adriana Simasook  Health Maintenance, Male A healthy lifestyle and preventative care can promote health and wellness.  Maintain regular health, dental, and eye exams.  Eat a healthy diet. Foods like vegetables, fruits, whole grains, low-fat dairy products, and lean protein foods contain the nutrients you need and are low in calories. Decrease your intake of foods high in solid fats, added sugars, and salt. Get information about a proper diet from your health care provider, if necessary.  Regular physical exercise is one of the most important things you can do for your health. Most adults should get at least 150 minutes of moderate-intensity exercise (any activity that increases your heart rate and causes you to sweat) each week. In addition, most adults need muscle-strengthening exercises on 2 or more days a week.   Maintain a healthy weight. The body mass index (BMI) is a screening tool to identify possible weight problems. It provides an estimate of body fat based on height and weight. Your health care provider can find your BMI and can help you achieve or maintain a healthy weight. For males 20 years and older:  A BMI below 18.5 is considered underweight.  A BMI of 18.5 to 24.9 is normal.  A BMI of 25 to 29.9 is considered overweight.  A BMI of 30 and above is considered obese.  Maintain normal blood lipids and cholesterol by exercising and minimizing your intake of saturated fat. Eat a balanced diet with plenty of fruits and vegetables. Blood tests for lipids and cholesterol should begin at age 47 and be repeated every 5 years. If your lipid or cholesterol levels are high, you are over age 47, or you are at high risk for heart disease, you may need your cholesterol levels checked more frequently.Ongoing high lipid and cholesterol levels should be treated with  medicines if diet and exercise are not working.  If you smoke, find out from your health care provider how to quit. If you do not use tobacco, do not start.  Lung cancer screening is recommended for adults aged 55-80 years who are at high risk for developing lung cancer because of a history of smoking. A yearly low-dose CT scan of the lungs is recommended for people who have at least a 30-pack-year history of smoking and are current smokers or have quit within the past 15 years. A pack year of smoking is smoking an average of 1 pack of cigarettes a day for 1 year (for example, a 30-pack-year history of smoking could mean smoking 1 pack a day for 30 years or 2 packs a day for 15 years). Yearly screening should continue until the smoker has stopped smoking for at least 15 years. Yearly screening should be stopped for people who develop a health problem that would prevent them from having lung cancer treatment.  If you choose to drink alcohol, do not have more than 2 drinks per day. One drink is considered to be 12 oz (360 mL) of beer, 5 oz (150 mL) of wine, or 1.5 oz (45 mL) of liquor.  Avoid the use of street drugs. Do not share needles with anyone. Ask for help if you need support or instructions about stopping the use of drugs.  High blood pressure causes heart disease and increases the risk of stroke. High blood pressure is more likely to develop  in:  People who have blood pressure in the end of the normal range (100-139/85-89 mm Hg).  People who are overweight or obese.  People who are African American.  If you are 76-70 years of age, have your blood pressure checked every 3-5 years. If you are 36 years of age or older, have your blood pressure checked every year. You should have your blood pressure measured twice--once when you are at a hospital or clinic, and once when you are not at a hospital or clinic. Record the average of the two measurements. To check your blood pressure when you are not  at a hospital or clinic, you can use:  An automated blood pressure machine at a pharmacy.  A home blood pressure monitor.  If you are 52-23 years old, ask your health care provider if you should take aspirin to prevent heart disease.  Diabetes screening involves taking a blood sample to check your fasting blood sugar level. This should be done once every 3 years after age 74 if you are at a normal weight and without risk factors for diabetes. Testing should be considered at a younger age or be carried out more frequently if you are overweight and have at least 1 risk factor for diabetes.  Colorectal cancer can be detected and often prevented. Most routine colorectal cancer screening begins at the age of 26 and continues through age 88. However, your health care provider may recommend screening at an earlier age if you have risk factors for colon cancer. On a yearly basis, your health care provider may provide home test kits to check for hidden blood in the stool. A small camera at the end of a tube may be used to directly examine the colon (sigmoidoscopy or colonoscopy) to detect the earliest forms of colorectal cancer. Talk to your health care provider about this at age 5 when routine screening begins. A direct exam of the colon should be repeated every 5-10 years through age 75, unless early forms of precancerous polyps or small growths are found.  People who are at an increased risk for hepatitis B should be screened for this virus. You are considered at high risk for hepatitis B if:  You were born in a country where hepatitis B occurs often. Talk with your health care provider about which countries are considered high risk.  Your parents were born in a high-risk country and you have not received a shot to protect against hepatitis B (hepatitis B vaccine).  You have HIV or AIDS.  You use needles to inject street drugs.  You live with, or have sex with, someone who has hepatitis B.  You  are a man who has sex with other men (MSM).  You get hemodialysis treatment.  You take certain medicines for conditions like cancer, organ transplantation, and autoimmune conditions.  Hepatitis C blood testing is recommended for all people born from [birth year] through 1967 and any individual with known risk factors for hepatitis C.  Healthy men should no longer receive prostate-specific antigen (PSA) blood tests as part of routine cancer screening. Talk to your health care provider about prostate cancer screening.  Testicular cancer screening is not recommended for adolescents or adult males who have no symptoms. Screening includes self-exam, a health care provider exam, and other screening tests. Consult with your health care provider about any symptoms you have or any concerns you have about testicular cancer.  Practice safe sex. Use condoms and avoid high-risk sexual practices to reduce the  spread of sexually transmitted infections (STIs).  You should be screened for STIs, including gonorrhea and chlamydia if:  You are sexually active and are younger than 24 years.  You are older than 24 years, and your health care provider tells you that you are at risk for this type of infection.  Your sexual activity has changed since you were last screened, and you are at an increased risk for chlamydia or gonorrhea. Ask your health care provider if you are at risk.  If you are at risk of being infected with HIV, it is recommended that you take a prescription medicine daily to prevent HIV infection. This is called pre-exposure prophylaxis (PrEP). You are considered at risk if:  You are a man who has sex with other men (MSM).  You are a heterosexual man who is sexually active with multiple partners.  You take drugs by injection.  You are sexually active with a partner who has HIV.  Talk with your health care provider about whether you are at high risk of being infected with HIV. If you choose to begin  PrEP, you should first be tested for HIV. You should then be tested every 3 months for as long as you are taking PrEP.  Use sunscreen. Apply sunscreen liberally and repeatedly throughout the day. You should seek shade when your shadow is shorter than you. Protect yourself by wearing long sleeves, pants, a wide-brimmed hat, and sunglasses year round whenever you are outdoors.  Tell your health care provider of new moles or changes in moles, especially if there is a change in shape or color. Also, tell your health care provider if a mole is larger than the size of a pencil eraser.  A one-time screening for abdominal aortic aneurysm (AAA) and surgical repair of large AAAs by ultrasound is recommended for men aged 41-75 years who are current or former smokers.  Stay current with your vaccines (immunizations).   This information is not intended to replace advice given to you by your health care provider. Make sure you discuss any questions you have with your health care provider.   Document Released: 03/31/2008 Document Revised: 10/24/2014 Document Reviewed: 02/28/2011 Elsevier Interactive Patient Education [employer].

## 2016-02-01 NOTE — Assessment & Plan Note (Signed)
Discussed treatment options today - NSAID's, compression, Injection, Oral steroids. Patient will continue NSAID's and compression.

## 2016-02-01 NOTE — Progress Notes (Signed)
Pre visit review using our clinic review tool, if applicable. No additional management support is needed unless otherwise documented below in the visit note. 

## 2016-02-02 LAB — LIPID PANEL
CHOL/HDL RATIO: 2
Cholesterol: 173 mg/dL (ref 0–200)
HDL: 70.1 mg/dL (ref >39.00)
LDL Cholesterol: 81 mg/dL (ref 0–99)
NONHDL: 102.9
Triglycerides: 112 mg/dL (ref 0.0–149.0)
VLDL: 22.4 mg/dL (ref 0.0–40.0)

## 2016-02-02 LAB — COMPREHENSIVE METABOLIC PANEL
ALT: 33 U/L (ref 0–53)
AST: 22 U/L (ref 0–37)
Albumin: 4.5 g/dL (ref 3.5–5.2)
Alkaline Phosphatase: 36 U/L — ABNORMAL LOW (ref 39–117)
BUN: 15 mg/dL (ref 6–23)
CO2: 29 meq/L (ref 19–32)
CREATININE: 1.04 mg/dL (ref 0.40–1.50)
Calcium: 9.7 mg/dL (ref 8.4–10.5)
Chloride: 106 mEq/L (ref 96–112)
GFR: 81.54 mL/min (ref >60.00)
GLUCOSE: 111 mg/dL — AB (ref 70–99)
POTASSIUM: 3.9 meq/L (ref 3.5–5.1)
Sodium: 143 mEq/L (ref 135–145)
TOTAL PROTEIN: 7.1 g/dL (ref 6.0–8.3)
Total Bilirubin: 0.5 mg/dL (ref 0.2–1.2)

## 2016-02-02 LAB — HEMOGLOBIN A1C: Hgb A1c MFr Bld: 5.6 % (ref 4.6–6.5)

## 2016-02-02 LAB — CBC
HEMATOCRIT: 45.6 % (ref 39.0–52.0)
Hemoglobin: 15.4 g/dL (ref 13.0–17.0)
MCHC: 33.9 g/dL (ref 30.0–36.0)
MCV: 88.7 fl (ref 78.0–100.0)
Platelets: 222 10*3/uL (ref 150.0–400.0)
RBC: 5.14 Mil/uL (ref 4.22–5.81)
RDW: 13.2 % (ref 11.5–15.5)
WBC: 9.8 10*3/uL (ref 4.0–10.5)

## 2017-03-24 ENCOUNTER — Other Ambulatory Visit: Payer: Self-pay | Admitting: Family Medicine

## 2017-03-27 ENCOUNTER — Ambulatory Visit: Payer: BLUE CROSS/BLUE SHIELD | Admitting: Family Medicine

## 2017-03-31 ENCOUNTER — Ambulatory Visit (INDEPENDENT_AMBULATORY_CARE_PROVIDER_SITE_OTHER): Payer: Self-pay | Admitting: Family Medicine

## 2017-03-31 DIAGNOSIS — J4540 Moderate persistent asthma, uncomplicated: Secondary | ICD-10-CM

## 2017-03-31 MED ORDER — BUDESONIDE-FORMOTEROL FUMARATE 160-4.5 MCG/ACT IN AERO: 2.0000 | INHALATION_SPRAY | Freq: Two times a day (BID) | RESPIRATORY_TRACT | 11 refills | Status: AC

## 2017-03-31 MED ORDER — ALBUTEROL SULFATE HFA 108 (90 BASE) MCG/ACT IN AERS: 2.0000 | INHALATION_SPRAY | Freq: Four times a day (QID) | RESPIRATORY_TRACT | 6 refills | Status: AC | PRN

## 2017-03-31 NOTE — Patient Instructions (Signed)
Continue your meds.  Follow up in 6 months.  Take care  Dr. Mumtaz Lovins  

## 2017-03-31 NOTE — Progress Notes (Signed)
Subjective:  Patient ID: Joel Cline, male    DOB: 01/04/1971  Age: 46 y.o. MRN: [id number]  CC: Follow up Asthma  HPI:  46 year old male presents for follow-up regarding his asthma.  Asthma  Moderate persistent asthma.  He has recently run out of his medication.  He's been experiencing more shortness of breath and tightness in the chest, particularly in the morning. His asthma is uncontrolled at this time.  He's using albuterol frequently and is now out of it.  He is in need of refill on his Symbicort as well as his albuterol. He states that he was doing well on this previously. He stopped for a period of time due to having well controlled symptoms.  No reports of chest tightness or shortness of breath currently. No fevers or chills. His symptoms are worsened without his medication and due to pollen and humidity.  Social Hx   Social History   Social History  . Marital status: Single    Spouse name: N/A  . Number of children: N/A  . Years of education: N/A   Social History Main Topics  . Smoking status: Never Smoker  . Smokeless tobacco: Never Used  . Alcohol use 25.2 oz/week    42 Cans of beer per week     Comment: daily  . Drug use: Yes    Types: Cocaine     Comment: when he was younger.  Marland Kitchen. Sexual activity: Not Currently   Other Topics Concern  . Not on file   Social History Narrative  . No narrative on file   Review of Systems  Constitutional: Negative.   Respiratory: Positive for chest tightness and shortness of breath.    Objective:  BP 102/70 (BP Location: Left Arm, Patient Position: Sitting, Cuff Size: Large)   Pulse 69   Temp 98.6 F (37 C) (Oral)   Resp 16   Wt 241 lb 4 oz (109.4 kg)   SpO2 97%   BMI 32.72 kg/m   BP/Weight 03/31/2017 02/01/2016 01/07/2015  Systolic BP 102 126 130  Diastolic BP 70 86 71  Wt. (Lbs) 241.25 234.25 -  BMI 32.72 31.76 -    Physical Exam  Constitutional: He is oriented to person, place, and time. He appears  well-developed. No distress.  Cardiovascular: Normal rate and regular rhythm.   Pulmonary/Chest: Effort normal and breath sounds normal. He has no wheezes. He has no rales.  Neurological: He is alert and oriented to person, place, and time.  Psychiatric: He has a normal mood and affect.  Vitals reviewed.  Lab Results  Component Value Date   WBC 9.8 02/01/2016   HGB 15.4 02/01/2016   HCT 45.6 02/01/2016   PLT 222.0 02/01/2016   GLUCOSE 111 (H) 02/01/2016   CHOL 173 02/01/2016   TRIG 112.0 02/01/2016   HDL 70.10 02/01/2016   LDLCALC 81 02/01/2016   ALT 33 02/01/2016   AST 22 02/01/2016   NA 143 02/01/2016   K 3.9 02/01/2016   CL 106 02/01/2016   CREATININE 1.04 02/01/2016   BUN 15 02/01/2016   CO2 29 02/01/2016   HGBA1C 5.6 02/01/2016    Assessment & Plan:   Problem List Items Addressed This Visit    Asthma, moderate persistent    Established problem, worsening. Restarting Symbicort. Refilling today.       Relevant Medications   albuterol (PROVENTIL HFA;VENTOLIN HFA) 108 (90 Base) MCG/ACT inhaler   budesonide-formoterol (SYMBICORT) 160-4.5 MCG/ACT inhaler      Meds ordered this  encounter  Medications  . albuterol (PROVENTIL HFA;VENTOLIN HFA) 108 (90 Base) MCG/ACT inhaler    Sig: Inhale 2 puffs into the lungs every 6 (six) hours as needed for wheezing or shortness of breath.    Dispense:  2 Inhaler    Refill:  6  . budesonide-formoterol (SYMBICORT) 160-4.5 MCG/ACT inhaler    Sig: Inhale 2 puffs into the lungs 2 (two) times daily.    Dispense:  1 Inhaler    Refill:  11   Follow-up: 6 months  Dorethia Jeanmarie Adriana Simas DO [facility]

## 2017-03-31 NOTE — Assessment & Plan Note (Signed)
Established problem, worsening. Restarting Symbicort. Refilling today.

## 2017-09-21 ENCOUNTER — Emergency Department (HOSPITAL_COMMUNITY): Payer: Self-pay

## 2017-09-21 ENCOUNTER — Other Ambulatory Visit: Payer: Self-pay

## 2017-09-21 ENCOUNTER — Emergency Department (HOSPITAL_COMMUNITY)
Admission: EM | Admit: 2017-09-21 | Discharge: 2017-09-21 | Disposition: A | Discharge status: Home / Self Care | Payer: Self-pay | Attending: Emergency Medicine | Admitting: Emergency Medicine

## 2017-09-21 ENCOUNTER — Encounter (HOSPITAL_COMMUNITY): Payer: Self-pay | Admitting: *Deleted

## 2017-09-21 DIAGNOSIS — M545 Low back pain, unspecified: Secondary | ICD-10-CM

## 2017-09-21 DIAGNOSIS — Z79899 Other long term (current) drug therapy: Secondary | ICD-10-CM | POA: Insufficient documentation

## 2017-09-21 DIAGNOSIS — J45909 Unspecified asthma, uncomplicated: Secondary | ICD-10-CM | POA: Insufficient documentation

## 2017-09-21 MED ORDER — METHOCARBAMOL 500 MG PO TABS
1000.0000 mg | ORAL_TABLET | Freq: Once | ORAL | Status: AC
  Administered 2017-09-21: 1000 mg via ORAL
  Filled 2017-09-21: qty 2

## 2017-09-21 MED ORDER — KETOROLAC TROMETHAMINE 30 MG/ML IJ SOLN
30.0000 mg | Freq: Once | INTRAMUSCULAR | Status: AC
  Administered 2017-09-21: 30 mg via INTRAMUSCULAR
  Filled 2017-09-21: qty 1

## 2017-09-21 MED ORDER — OXYCODONE-ACETAMINOPHEN 5-325 MG PO TABS
1.0000 | ORAL_TABLET | Freq: Once | ORAL | Status: AC
  Administered 2017-09-21: 1 via ORAL
  Filled 2017-09-21: qty 1

## 2017-09-21 MED ORDER — PREDNISONE 10 MG (21) PO TBPK: ORAL_TABLET | Freq: Every day | ORAL | 0 refills | Status: AC

## 2017-09-21 MED ORDER — OXYCODONE-ACETAMINOPHEN 5-325 MG PO TABS
1.0000 | ORAL_TABLET | ORAL | Status: DC | PRN
  Administered 2017-09-21: 1 via ORAL
  Filled 2017-09-21: qty 1

## 2017-09-21 MED ORDER — OXYCODONE-ACETAMINOPHEN 5-325 MG PO TABS: 1.0000 | ORAL_TABLET | Freq: Four times a day (QID) | ORAL | 0 refills | Status: AC | PRN

## 2017-09-21 MED ORDER — PREDNISONE 20 MG PO TABS
60.0000 mg | ORAL_TABLET | Freq: Once | ORAL | Status: AC
  Administered 2017-09-21: 60 mg via ORAL
  Filled 2017-09-21: qty 3

## 2017-09-21 MED ORDER — METHOCARBAMOL 500 MG PO TABS: 500.0000 mg | ORAL_TABLET | Freq: Two times a day (BID) | ORAL | 0 refills | Status: AC

## 2017-09-21 NOTE — Discharge Instructions (Signed)
Medications: Percocet, Robaxin, Sterapred  Treatment: Take 1-2 Percocet every 6 hours as needed for severe pain. Take ibuprofen as prescribed for mild to moderate pain.  I recommend taking ibuprofen regularly to help with inflammation.  Take Sterapred as prescribed until completed.  Use ice and heat alternating 20 minutes on, 20 minutes off.  Attempt exercises and stretches as tolerated daily.  Avoid heavy lifting as best as possible.  Follow-up: Please follow-up with Dr. Franky Machoabbell at your scheduled appointment.  Please return to the emergency department if you develop any new or worsening symptoms including loss of bowel or bladder control, complete numbness of your groin or legs, or any other new or concerning symptoms.

## 2017-09-21 NOTE — ED Notes (Signed)
Got patient into a gown on the monitor patient is resting with family at bedside 

## 2017-09-21 NOTE — ED Provider Notes (Signed)
[facility] EMERGENCY DEPARTMENT Provider Note   CSN: [id number] Arrival date & time: 09/21/17  4098     History   Chief Complaint Chief Complaint  Patient presents with  . Back Pain    HPI Joel Cline is a 46 y.o. male with history of diskectomy (L4-5) in 2016 who presents with a one-month history of worsening right low back pain.  His pain got significantly worse 4 days ago.  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  He reports localized pain to his right lumbar spine.  It is worse with movement.  He currently has no radiation of pain to his legs.  He denies any numbness or tingling, saddle anesthesia, loss of bowel or bladder control, recent procedure tobacco, history of IVDU, known cancer, or fever.  He has taken ibuprofen over the past month which has been helping, however did not help this morning.  HPI  Past Medical History:  Diagnosis Date  . Asthma   . Chicken pox   . Depression   . GERD (gastroesophageal reflux disease)     Patient Active Problem List   Diagnosis Date Noted  . Preventative health care 02/01/2016  . Asthma, moderate persistent 02/01/2016    Past Surgical History:  Procedure Laterality Date  . BACK SURGERY    . LUMBAR LAMINECTOMY/DECOMPRESSION MICRODISCECTOMY Left 01/06/2015   Procedure: Left Lumbar 4-5 diskectomy;  Surgeon: Coletta Memos, MD;  Location: [facility];  Service: Neurosurgery;  Laterality: Left;  Left L4-5 diskectomy  . TONSILLECTOMY         Home Medications    Prior to Admission medications   Medication Sig Start Date End Date Taking? Authorizing Provider  albuterol (PROVENTIL HFA;VENTOLIN HFA) 108 (90 Base) MCG/ACT inhaler Inhale 2 puffs into the lungs every 6 (six) hours as needed for wheezing or shortness of breath. 03/31/17  Yes Cook, Jayce G, DO  ibuprofen (ADVIL,MOTRIN) 200 MG tablet Take 200 mg by mouth every 6 (six) hours as needed for moderate pain.   Yes [provider]  budesonide-formoterol (SYMBICORT) 160-4.5 MCG/ACT inhaler Inhale 2 puffs into the lungs 2 (two) times daily. Patient not taking: Reported on 09/21/2017 03/31/17   Tommie Sams, DO  methocarbamol (ROBAXIN) 500 MG tablet Take 1 tablet (500 mg total) by mouth 2 (two) times daily. 09/21/17   Emi Holes, PA-C  oxyCODONE-acetaminophen (PERCOCET/ROXICET) 5-325 MG tablet Take 1-2 tablets by mouth every 6 (six) hours as needed for severe pain. 09/21/17   Lucella Pommier, Waylan Boga, PA-C  predniSONE (STERAPRED UNI-PAK 21 TAB) 10 MG (21) TBPK tablet Take by mouth daily. Take 6 tabs by mouth daily  for 2 days, then 5 tabs for 2 days, then 4 tabs for 2 days, then 3 tabs for 2 days, 2 tabs for 2 days, then 1 tab by mouth daily for 2 days 09/21/17   Emi Holes, PA-C    Family History Family History  Problem Relation Age of Onset  . Diabetes Father   . Arthritis Sister     Social History Social History   Tobacco Use  . Smoking status: Never Smoker  . Smokeless tobacco: Never Used  Substance Use Topics  . Alcohol use: Yes    Alcohol/week: 25.2 oz    Types: 42 Cans of beer per week    Comment: daily  . Drug use: No    Comment: when he was younger.     Allergies   Patient has  no known allergies.   Review of Systems Review of Systems  Constitutional: Negative for chills and fever.  HENT: Negative for facial swelling and sore throat.   Respiratory: Negative for shortness of breath.   Cardiovascular: Negative for chest pain.  Gastrointestinal: Negative for abdominal pain, nausea and vomiting.  Genitourinary: Negative for dysuria.  Musculoskeletal: Positive for back pain.  Skin: Negative for rash and wound.  Neurological: Negative for numbness and headaches.  Psychiatric/Behavioral: The patient is not nervous/anxious.      Physical Exam Updated Vital Signs BP (!) 154/110   Pulse 95   Temp 97.6 F (36.4 C) (Oral)   Resp 17   SpO2 97%   Physical Exam  Constitutional:  He appears well-developed and well-nourished. No distress.  HENT:  Head: Normocephalic and atraumatic.  Mouth/Throat: Oropharynx is clear and moist. No oropharyngeal exudate.  Eyes: Conjunctivae are normal. Pupils are equal, round, and reactive to light. Right eye exhibits no discharge. Left eye exhibits no discharge. No scleral icterus.  Neck: Normal range of motion. Neck supple. No thyromegaly present.  Cardiovascular: Normal rate, regular rhythm, normal heart sounds and intact distal pulses. Exam reveals no gallop and no friction rub.  No murmur heard. Pulmonary/Chest: Effort normal and breath sounds normal. No stridor. No respiratory distress. He has no wheezes. He has no rales.  Abdominal: Soft. Bowel sounds are normal. He exhibits no distension. There is no tenderness. There is no rebound and no guarding.  Musculoskeletal: He exhibits no edema.       Thoracic back: He exhibits no tenderness and no bony tenderness.       Lumbar back: He exhibits tenderness (Mild R lumbar only as indicated). He exhibits no bony tenderness.       Back:  Lymphadenopathy:    He has no cervical adenopathy.  Neurological: He is alert. Coordination normal.  Reflex Scores:      Patellar reflexes are 2+ on the right side and 1+ on the left side. Normal sensation, 5/5 strength, reflexes intact bilaterally; patient is ambulatory without difficulty  Skin: Skin is warm and dry. No rash noted. He is not diaphoretic. No pallor.  Psychiatric: He has a normal mood and affect.  Nursing note and vitals reviewed.    ED Treatments / Results  Labs (all labs ordered are listed, but only abnormal results are displayed) Labs Reviewed - No data to display  EKG  EKG Interpretation None       Radiology Dg Lumbar Spine Complete  Result Date: 09/21/2017 CLINICAL DATA:  Intermittent low back pain over the last month. Worsening today. EXAM: LUMBAR SPINE - COMPLETE 4+ VIEW COMPARISON:  01/06/2015 FINDINGS: Five lumbar  type vertebral bodies. Disc space narrowing L4-5 and L5-S1. Mild lower lumbar facet osteoarthritis. No acute radiographic finding. IMPRESSION: No acute radiographic finding. Lower lumbar disc space narrowing and facet osteoarthritis. Electronically Signed   By: Paulina FusiMark  Shogry M.D.   On: 09/21/2017 11:59    Procedures Procedures (including critical care time)  Medications Ordered in ED Medications  oxyCODONE-acetaminophen (PERCOCET/ROXICET) 5-325 MG per tablet 1 tablet (1 tablet Oral Given 09/21/17 0753)  oxyCODONE-acetaminophen (PERCOCET/ROXICET) 5-325 MG per tablet 1 tablet (1 tablet Oral Given 09/21/17 1143)  methocarbamol (ROBAXIN) tablet 1,000 mg (1,000 mg Oral Given 09/21/17 1144)  ketorolac (TORADOL) 30 MG/ML injection 30 mg (30 mg Intramuscular Given 09/21/17 1400)  oxyCODONE-acetaminophen (PERCOCET/ROXICET) 5-325 MG per tablet 1 tablet (1 tablet Oral Given 09/21/17 1439)  predniSONE (DELTASONE) tablet 60 mg (60 mg Oral Given  09/21/17 1637)     Initial Impression / Assessment and Plan / ED Course  I have reviewed the triage vital signs and the nursing notes.  Pertinent labs & imaging results that were available during my care of the patient were reviewed by me and considered in my medical decision making (see chart for details).     Patient feeling improved after Percocet and Robaxin in the ED, however had increased pain when he moved a certain way.  Another Percocet and Toradol IM given in the ED he did improve.  We will also give prednisone taper with first dose in the ED.  No signs of acute compression at this time.  Normal neuro exam.  Afebrile.  X-ray shows no acute findings, but lumbar disc space narrowing and facet osteoarthritis at L4-5 and L5-S1.  No indication for emergent MRI at this time.  I contacted patient's neurosurgeon's office, Dr. Franky Machoabbell, who will contact the patient to arrange follow-up.  Will discharge home with short course of Percocet, Sterapred taper, Robaxin, and  continue ibuprofen.  I reviewed the [place] narcotic database and found no discrepancies.  Strict return precautions given to the patient.  He understands and agrees with plan.  Patient also evaluated by Dr. Clarene DukeLittle who guided the patient's management and agrees with plan.  Patient vitals stable and discharged in satisfactory condition.  Final Clinical Impressions(s) / ED Diagnoses   Final diagnoses:  Acute right-sided low back pain without sciatica    ED Discharge Orders        Ordered    oxyCODONE-acetaminophen (PERCOCET/ROXICET) 5-325 MG tablet  Every 6 hours PRN     09/21/17 1536    predniSONE (STERAPRED UNI-PAK 21 TAB) 10 MG (21) TBPK tablet  Daily     09/21/17 1536    methocarbamol (ROBAXIN) 500 MG tablet  2 times daily     09/21/17 1536       Emi HolesLaw, Judia Arnott M, PA-C 09/21/17 1702    Little, Ambrose Finlandachel Morgan, MD 09/21/17 2033

## 2017-09-21 NOTE — ED Triage Notes (Addendum)
To ED for eval of lower back pain since this am. Pt states hx of same with back surgery. 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 No radiation down legs. Pt is tearful in triage. States he took 3 Motrin this am without relief. States he feels like pain meds will mask the problem but can't get comfortable at present

## 2017-09-21 NOTE — ED Notes (Signed)
Patient transported to X-ray 

## 2018-05-20 ENCOUNTER — Other Ambulatory Visit: Payer: Self-pay | Admitting: Family Medicine

## 2018-05-22 ENCOUNTER — Other Ambulatory Visit: Payer: Self-pay | Admitting: Family Medicine

## 2019-12-05 IMAGING — DX DG LUMBAR SPINE COMPLETE 4+V
5 series · 5 of 5 positions shown · non-contrast
Comparison: 01/06/2015

CLINICAL DATA: Intermittent low back pain over the last month.
Worsening today.

EXAM:
LUMBAR SPINE - COMPLETE 4+ VIEW

[l-spine ap]
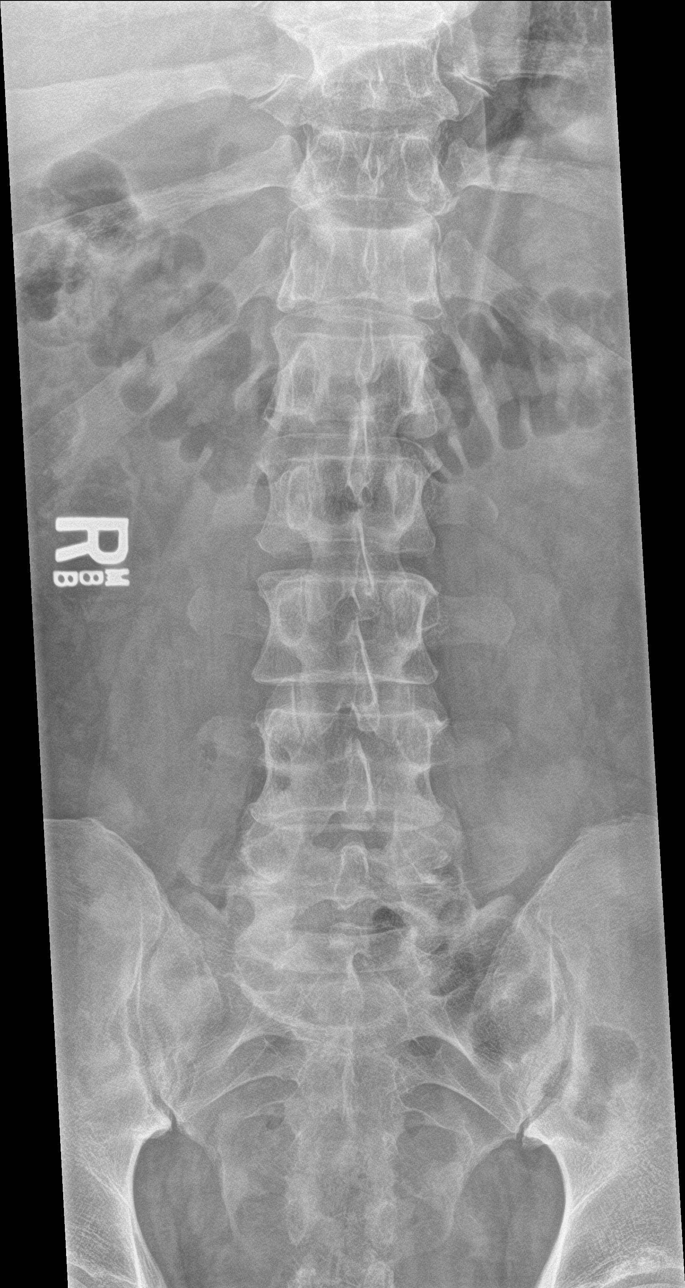

[l-spine obl (1 of 2)]
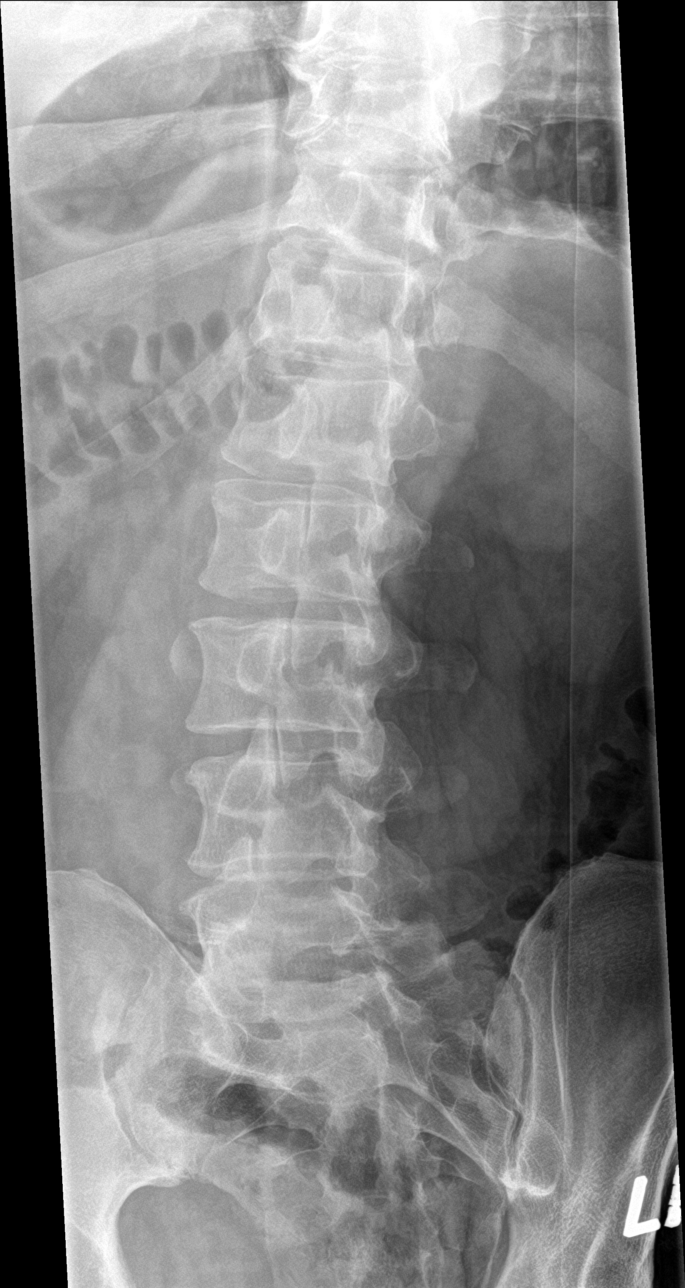

[l-spine obl (2 of 2)]
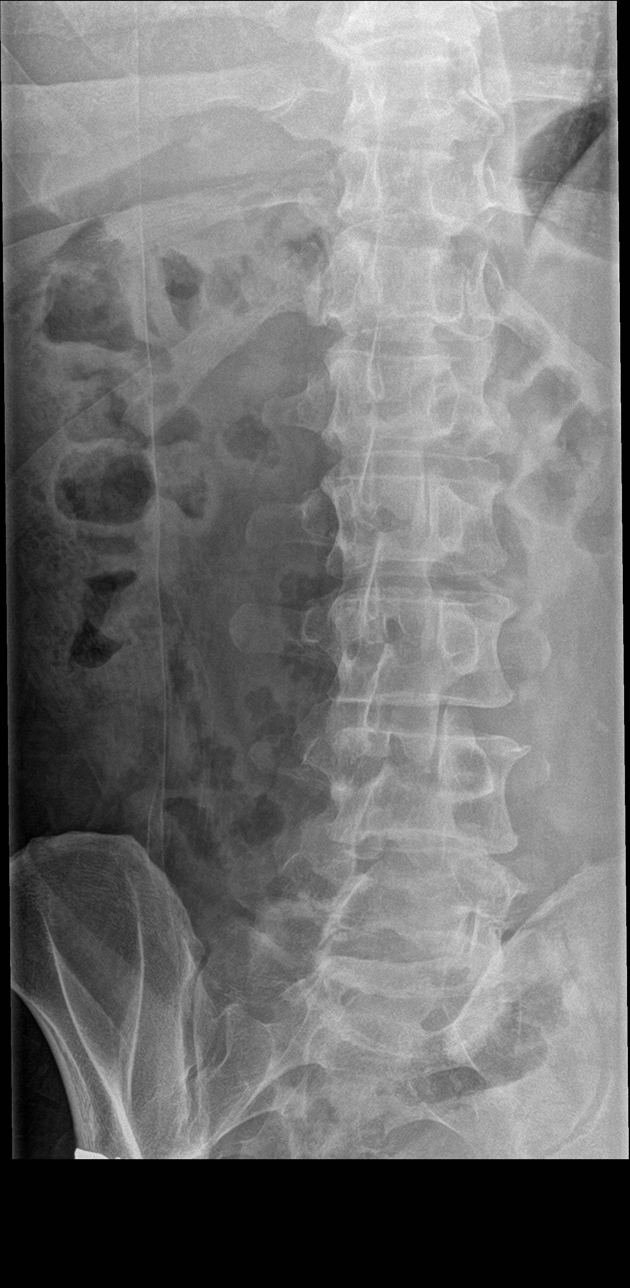

[l-spine lat]
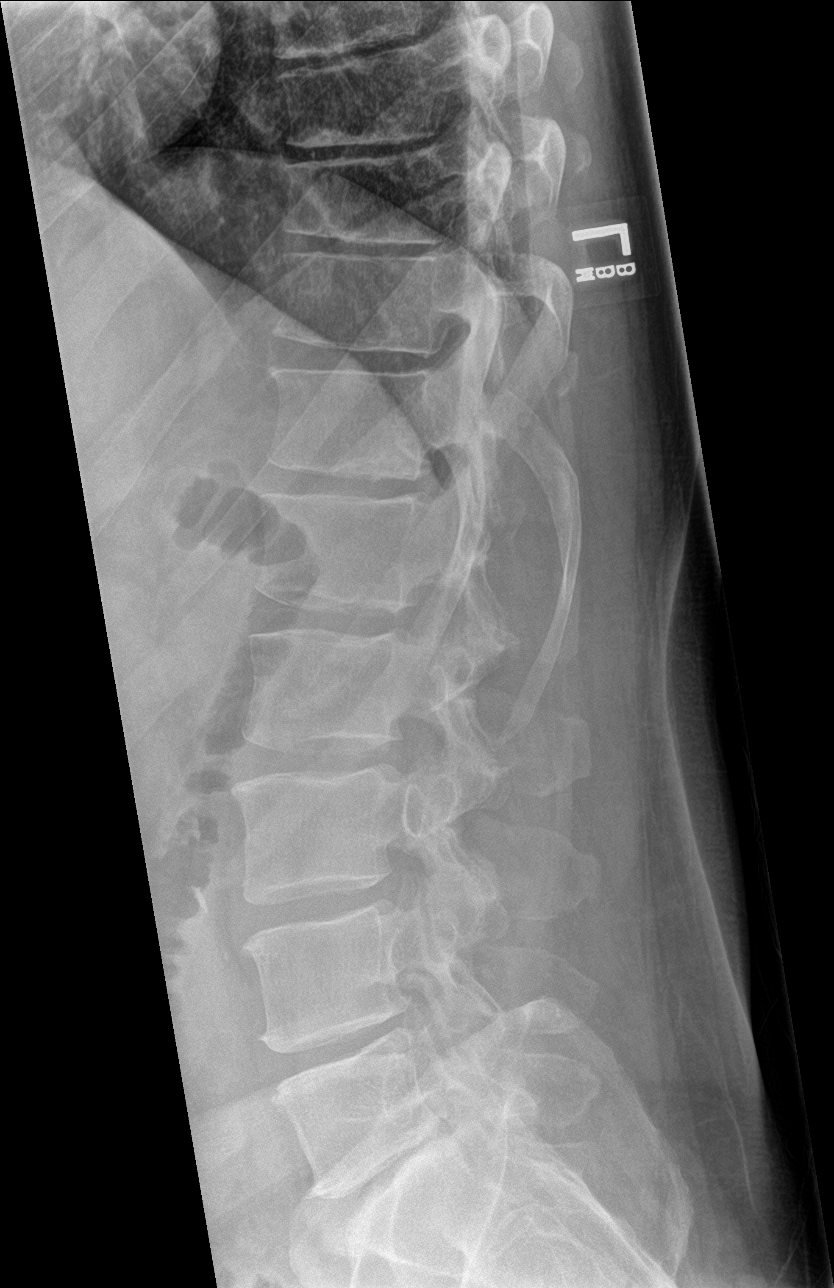

[l-spine spot]
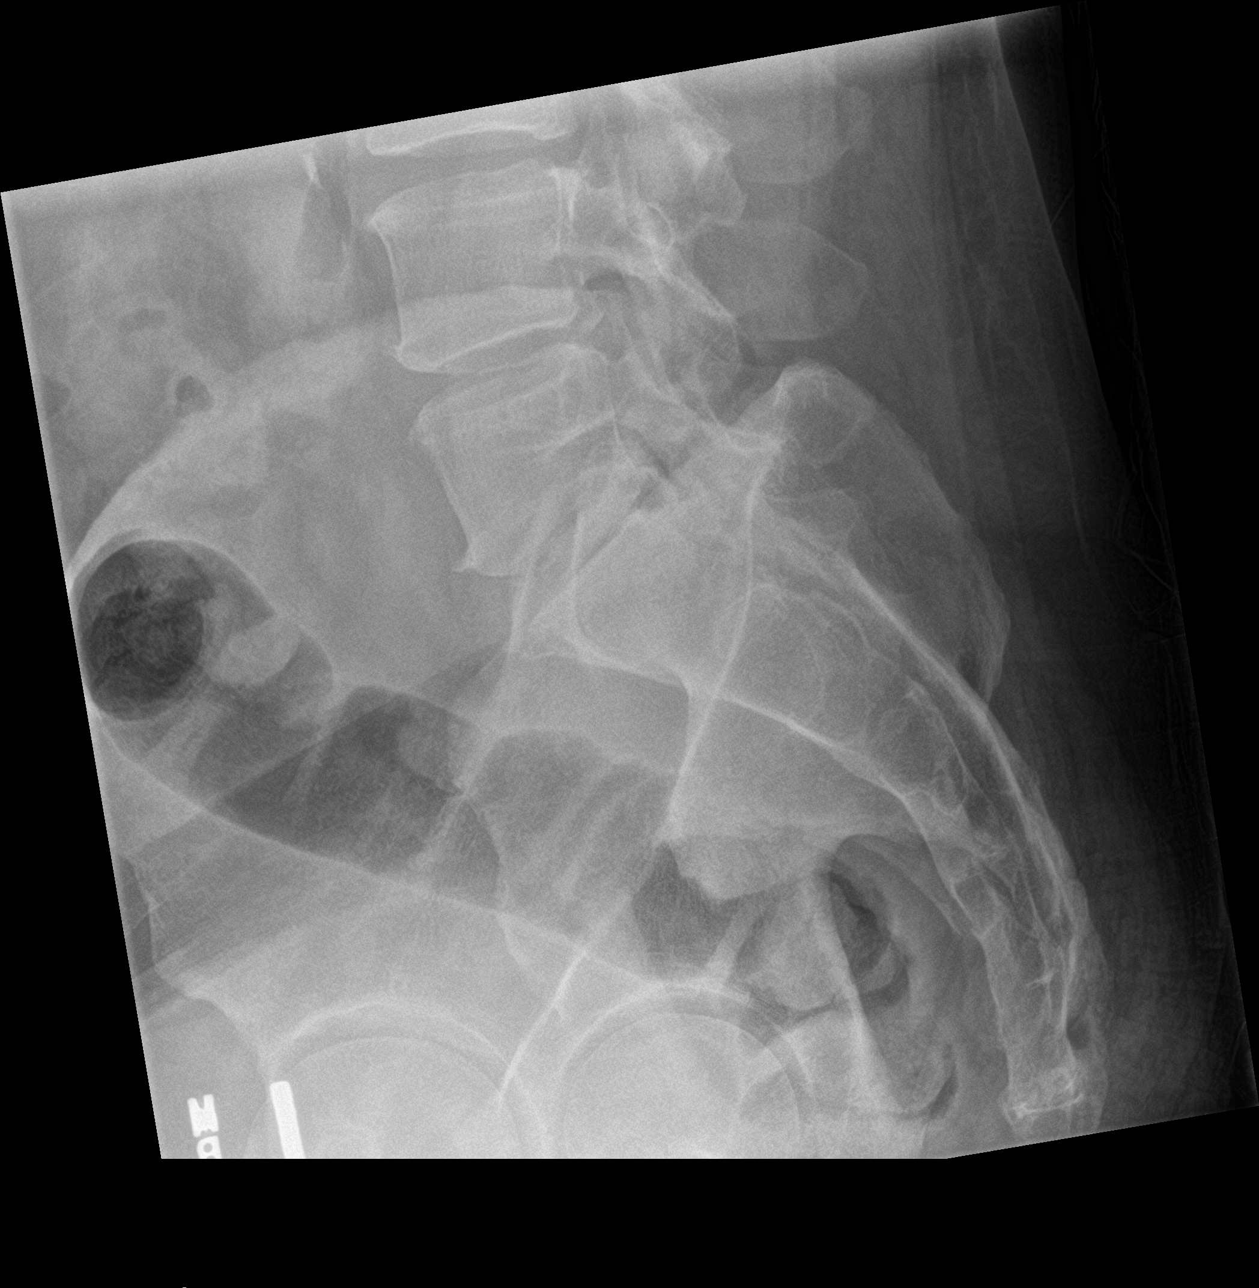

[5 of 5 positions shown; findings below may reference images not displayed]

FINDINGS: Five lumbar type vertebral bodies. Disc space narrowing L4-5 and
L5-S1. Mild lower lumbar facet osteoarthritis. No acute radiographic
finding.
IMPRESSION: No acute radiographic finding. Lower lumbar disc space narrowing and
facet osteoarthritis.
